# Patient Record
Sex: Female | Born: 1955 | Race: Black or African American | Hispanic: No | Marital: Married
Health system: Southern US, Community
[De-identification: ages and names within clinical notes are randomized; demographics above are authoritative.]

## PROBLEM LIST (undated history)

## (undated) DIAGNOSIS — K5790 Diverticulosis of intestine, part unspecified, without perforation or abscess without bleeding: Secondary | ICD-10-CM

## (undated) DIAGNOSIS — J45909 Unspecified asthma, uncomplicated: Secondary | ICD-10-CM

## (undated) HISTORY — PX: APPENDECTOMY: SHX54

## (undated) HISTORY — PX: ABDOMINAL HYSTERECTOMY: SHX81

## (undated) HISTORY — PX: REPLACEMENT TOTAL KNEE: SUR1224

---

## 2016-11-09 HISTORY — PX: REPLACEMENT TOTAL KNEE: SUR1224

## 2017-01-28 ENCOUNTER — Emergency Department (HOSPITAL_COMMUNITY)
Admission: EM | Admit: 2017-01-28 | Discharge: 2017-01-29 | Disposition: A | Discharge status: Home / Self Care | Attending: Emergency Medicine | Admitting: Emergency Medicine

## 2017-01-28 ENCOUNTER — Emergency Department (HOSPITAL_COMMUNITY)

## 2017-01-28 ENCOUNTER — Encounter (HOSPITAL_COMMUNITY): Payer: Self-pay | Admitting: Emergency Medicine

## 2017-01-28 ENCOUNTER — Emergency Department (HOSPITAL_COMMUNITY)
Admission: EM | Admit: 2017-01-28 | Discharge: 2017-01-28 | Discharge status: Against Medical Advice | Source: Home / Self Care

## 2017-01-28 DIAGNOSIS — R112 Nausea with vomiting, unspecified: Secondary | ICD-10-CM | POA: Diagnosis not present

## 2017-01-28 DIAGNOSIS — R1032 Left lower quadrant pain: Secondary | ICD-10-CM | POA: Diagnosis present

## 2017-01-28 DIAGNOSIS — Z96652 Presence of left artificial knee joint: Secondary | ICD-10-CM | POA: Insufficient documentation

## 2017-01-28 DIAGNOSIS — N289 Disorder of kidney and ureter, unspecified: Secondary | ICD-10-CM | POA: Diagnosis not present

## 2017-01-28 DIAGNOSIS — K5792 Diverticulitis of intestine, part unspecified, without perforation or abscess without bleeding: Secondary | ICD-10-CM | POA: Insufficient documentation

## 2017-01-28 DIAGNOSIS — J45909 Unspecified asthma, uncomplicated: Secondary | ICD-10-CM | POA: Diagnosis not present

## 2017-01-28 HISTORY — DX: Unspecified asthma, uncomplicated: J45.909

## 2017-01-28 HISTORY — DX: Diverticulosis of intestine, part unspecified, without perforation or abscess without bleeding: K57.90

## 2017-01-28 LAB — I-STAT CHEM 8, ED
BUN: 26 mg/dL — ABNORMAL HIGH (ref 6–20)
CHLORIDE: 105 mmol/L (ref 101–111)
Calcium, Ion: 1.09 mmol/L — ABNORMAL LOW (ref 1.15–1.40)
Creatinine, Ser: 1 mg/dL (ref 0.44–1.00)
GLUCOSE: 117 mg/dL — AB (ref 65–99)
HCT: 37 % (ref 36.0–46.0)
Hemoglobin: 12.6 g/dL (ref 12.0–15.0)
POTASSIUM: 4.1 mmol/L (ref 3.5–5.1)
Sodium: 141 mmol/L (ref 135–145)
TCO2: 25 mmol/L (ref 22–32)

## 2017-01-28 LAB — URINALYSIS, ROUTINE W REFLEX MICROSCOPIC
BILIRUBIN URINE: NEGATIVE
Bacteria, UA: NONE SEEN
Glucose, UA: NEGATIVE mg/dL
Hgb urine dipstick: NEGATIVE
Ketones, ur: NEGATIVE mg/dL
Leukocytes, UA: NEGATIVE
Nitrite: NEGATIVE
PH: 6 (ref 5.0–8.0)
Protein, ur: 30 mg/dL — AB

## 2017-01-28 LAB — COMPREHENSIVE METABOLIC PANEL
ALT: 22 U/L (ref 14–54)
ANION GAP: 15 (ref 5–15)
AST: 24 U/L (ref 15–41)
Albumin: 4.4 g/dL (ref 3.5–5.0)
Alkaline Phosphatase: 53 U/L (ref 38–126)
BUN: 22 mg/dL — ABNORMAL HIGH (ref 6–20)
CHLORIDE: 103 mmol/L (ref 101–111)
CO2: 22 mmol/L (ref 22–32)
Calcium: 9.8 mg/dL (ref 8.9–10.3)
Creatinine, Ser: 1.18 mg/dL — ABNORMAL HIGH (ref 0.44–1.00)
GFR calc non Af Amer: 48 mL/min — ABNORMAL LOW (ref >60)
GFR, EST AFRICAN AMERICAN: 56 mL/min — AB (ref >60)
Glucose, Bld: 127 mg/dL — ABNORMAL HIGH (ref 65–99)
POTASSIUM: 3.6 mmol/L (ref 3.5–5.1)
SODIUM: 140 mmol/L (ref 135–145)
Total Bilirubin: 0.5 mg/dL (ref 0.3–1.2)
Total Protein: 7.8 g/dL (ref 6.5–8.1)

## 2017-01-28 LAB — LIPASE, BLOOD: LIPASE: 35 U/L (ref 11–51)

## 2017-01-28 LAB — CBC
HCT: 33.8 % — ABNORMAL LOW (ref 36.0–46.0)
HEMOGLOBIN: 10.8 g/dL — AB (ref 12.0–15.0)
MCH: 27.1 pg (ref 26.0–34.0)
MCHC: 32 g/dL (ref 30.0–36.0)
MCV: 84.9 fL (ref 78.0–100.0)
PLATELETS: 259 10*3/uL (ref 150–400)
RBC: 3.98 MIL/uL (ref 3.87–5.11)
RDW: 14.2 % (ref 11.5–15.5)
WBC: 8.3 10*3/uL (ref 4.0–10.5)

## 2017-01-28 LAB — I-STAT TROPONIN, ED: TROPONIN I, POC: 0 ng/mL (ref 0.00–0.08)

## 2017-01-28 MED ORDER — HYDROMORPHONE HCL 1 MG/ML IJ SOLN
0.5000 mg | Freq: Once | INTRAMUSCULAR | Status: AC
  Administered 2017-01-28: 0.5 mg via INTRAVENOUS
  Filled 2017-01-28: qty 1

## 2017-01-28 MED ORDER — AMOXICILLIN-POT CLAVULANATE 875-125 MG PO TABS: 1.0000 | ORAL_TABLET | Freq: Two times a day (BID) | ORAL | 0 refills | Status: AC

## 2017-01-28 MED ORDER — SODIUM CHLORIDE 0.9 % IV SOLN
1.5000 g | Freq: Once | INTRAVENOUS | Status: AC
  Administered 2017-01-28: 1.5 g via INTRAVENOUS
  Filled 2017-01-28: qty 1.5

## 2017-01-28 MED ORDER — IOPAMIDOL (ISOVUE-300) INJECTION 61%
INTRAVENOUS | Status: AC
  Administered 2017-01-28: 100 mL via INTRAVENOUS
  Filled 2017-01-28: qty 100

## 2017-01-28 MED ORDER — HYDROMORPHONE HCL 1 MG/ML IJ SOLN
1.0000 mg | Freq: Once | INTRAMUSCULAR | Status: AC
  Administered 2017-01-28: 1 mg via INTRAVENOUS
  Filled 2017-01-28: qty 1

## 2017-01-28 MED ORDER — SODIUM CHLORIDE 0.9 % IV BOLUS (SEPSIS)
1000.0000 mL | Freq: Once | INTRAVENOUS | Status: AC
  Administered 2017-01-28: 1000 mL via INTRAVENOUS

## 2017-01-28 MED ORDER — ONDANSETRON 4 MG PO TBDP
4.0000 mg | ORAL_TABLET | Freq: Once | ORAL | Status: AC | PRN
  Administered 2017-01-28: 4 mg via ORAL
  Filled 2017-01-28: qty 1

## 2017-01-28 MED ORDER — ONDANSETRON HCL 4 MG/2ML IJ SOLN
4.0000 mg | Freq: Once | INTRAMUSCULAR | Status: AC
  Administered 2017-01-28: 4 mg via INTRAVENOUS
  Filled 2017-01-28: qty 2

## 2017-01-28 MED ORDER — ONDANSETRON 4 MG PO TBDP: ORAL_TABLET | ORAL | 0 refills | Status: AC

## 2017-01-28 NOTE — ED Triage Notes (Signed)
Pt was seen here earlier today for same but LWBS.

## 2017-01-28 NOTE — ED Notes (Signed)
Attempted an EKG, but Dr. Fayrene FearingJames was unable to read. Pt was crying uncontrollably, yelling and moving wildly side to side.

## 2017-01-28 NOTE — ED Triage Notes (Signed)
BIB EMS from home, called out for abd pain, back pain, chest pain. Pt refusing VS in triage, refusing EKG. Pt refuses to sit down and let this RN triage.

## 2017-01-28 NOTE — Discharge Instructions (Signed)
Gradually increase diet as tolerated starting with liquid and softs. Take antibiotics as directed. Take Zofran as needed for nausea and vomiting. See a clinician if you cannot keep her antibiotics and, recurrent vomiting, uncontrolled pain, persistent fevers or new concerns.  Have your primary doctor order MRI of your kidneys as you have a lesion.

## 2017-01-28 NOTE — ED Notes (Signed)
Called pt for triage, no answer 

## 2017-01-28 NOTE — ED Provider Notes (Signed)
[facility] EMERGENCY DEPARTMENT Provider Note   CSN: [id number] Arrival date & time: 01/28/17  1949     History   Chief Complaint Chief Complaint  Patient presents with  . Abdominal Pain  . Chest Pain    HPI Rachel Riley is a 62 y.o. female.  Patient presents with severe left lower quadrant abdominal pain started earlier this morning. This feels similar to her diverticulitis history. The pain however is more severe and patient has vomiting with it as well. Patient denies known history of kidney stones. Patient's had appendix removed in the past. Patient recently had knee replacement has been on pain meds for that and sciatica as well. No fevers or chills. No significant radiation.      Past Medical History:  Diagnosis Date  . Asthma   . Diverticulosis     There are no active problems to display for this patient.   Past Surgical History:  Procedure Laterality Date  . ABDOMINAL HYSTERECTOMY    . APPENDECTOMY    . 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 REPLACEMENT TOTAL KNEE Left     OB History    No data available       Home Medications    Prior to Admission medications   Medication Sig Start Date End Date Taking? Authorizing Provider  traMADol (ULTRAM) 50 MG tablet 50 mg.    Yes [provider]  amoxicillin-clavulanate (AUGMENTIN) 875-125 MG tablet Take 1 tablet by mouth 2 (two) times daily. One po bid x 7 days 01/28/17   Blane Ohara, MD  ondansetron (ZOFRAN ODT) 4 MG disintegrating tablet 4mg  ODT q4 hours prn nausea/vomit 01/28/17   Blane Ohara, MD    Family History No family history on file.  Social History Social History   Tobacco Use  . Smoking status: Not on file  Substance Use Topics  . Alcohol use: Not on file  . Drug use: Not on file     Allergies   Patient has no known allergies.   Review of Systems Review of Systems  Constitutional: Positive for fatigue. Negative for chills and fever.  HENT: Negative  for congestion.   Eyes: Negative for visual disturbance.  Respiratory: Negative for shortness of breath.   Cardiovascular: Negative for chest pain.  Gastrointestinal: Positive for abdominal pain, nausea and vomiting.  Genitourinary: Negative for dysuria and flank pain.  Musculoskeletal: Negative for back pain, neck pain and neck stiffness.  Skin: Negative for rash.  Neurological: Negative for light-headedness and headaches.     Physical Exam Updated Vital Signs BP 124/60   Pulse 76   Temp 98.1 F (36.7 C) (Oral)   Resp 13   SpO2 98%   Physical Exam  Constitutional: She is oriented to person, place, and time. She appears well-developed and well-nourished.  HENT:  Head: Normocephalic and atraumatic.  Eyes: Conjunctivae are normal. Right eye exhibits no discharge. Left eye exhibits no discharge.  Neck: Normal range of motion. Neck supple. No tracheal deviation present.  Cardiovascular: Normal rate and regular rhythm.  Pulmonary/Chest: Effort normal and breath sounds normal.  Abdominal: Soft. She exhibits no distension. There is tenderness (left lower abdomen). There is no guarding.  Musculoskeletal: She exhibits no edema.  Neurological: She is alert and oriented to person, place, and time.  Skin: Skin is warm. No rash noted.  Psychiatric: She has a normal mood and affect.  Nursing note and vitals reviewed.    ED Treatments / Results  Labs (all labs ordered  are listed, but only abnormal results are displayed) Labs Reviewed  COMPREHENSIVE METABOLIC PANEL - Abnormal; Notable for the following components:      Result Value   Glucose, Bld 127 (*)    BUN 22 (*)    Creatinine, Ser 1.18 (*)    GFR calc non Af Amer 48 (*)    GFR calc Af Amer 56 (*)    All other components within normal limits  CBC - Abnormal; Notable for the following components:   Hemoglobin 10.8 (*)    HCT 33.8 (*)    All other components within normal limits  URINALYSIS, ROUTINE W REFLEX MICROSCOPIC -  Abnormal; Notable for the following components:   Specific Gravity, Urine >1.046 (*)    Protein, ur 30 (*)    Squamous Epithelial / LPF 0-5 (*)    All other components within normal limits  I-STAT CHEM 8, ED - Abnormal; Notable for the following components:   BUN 26 (*)    Glucose, Bld 117 (*)    Calcium, Ion 1.09 (*)    All other components within normal limits  LIPASE, BLOOD  I-STAT TROPONIN, ED    EKG  EKG Interpretation  Date/Time:  Sunday January 28 2017 21:38:03 EST Ventricular Rate:  71 PR Interval:    QRS Duration: 94 QT Interval:  416 QTC Calculation: 453 R Axis:   75 Text Interpretation:  Sinus RSR' in V1 or V2, right VCD or RVH Borderline T abnormalities, anterior leads Confirmed by Blane Ohara [phone]) on 01/28/2017 10:41:55 PM       Radiology Ct Abdomen Pelvis W Contrast  Result Date: 01/28/2017 CLINICAL DATA:  Left lower quadrant abdominal pain EXAM: CT ABDOMEN AND PELVIS WITH CONTRAST TECHNIQUE: Multidetector CT imaging of the abdomen and pelvis was performed using the standard protocol following bolus administration of intravenous contrast. CONTRAST:  ISOVUE-300 IOPAMIDOL (ISOVUE-300) INJECTION 61% COMPARISON:  None. FINDINGS: Lower chest: Lung bases demonstrate no acute consolidation or effusion. Coronary artery calcification. Borderline cardiomegaly. Hepatobiliary: No focal liver abnormality is seen. No gallstones, gallbladder wall thickening, or biliary dilatation. Pancreas: Unremarkable. No pancreatic ductal dilatation or surrounding inflammatory changes. Spleen: Normal in size without focal abnormality. Adrenals/Urinary Tract: Adrenal glands are within normal limits. Kidneys show no hydronephrosis. There are multiple cysts within the bilateral kidneys. There are additional subcentimeter hypodensities too small to further characterize. 3.2 cm intermediate density lower pole lesion on the left. Bladder within normal limits Stomach/Bowel: The stomach is  nonenlarged. Surgical changes at the cecum consistent with appendectomy. Sigmoid colon diverticular present. Questionable mild wall thickening of sigmoid colon versus underdistention. No significant inflammatory changes Vascular/Lymphatic: Moderate aortic atherosclerosis. No aneurysmal dilatation. No significantly enlarged lymph nodes Reproductive: Status post hysterectomy. No adnexal masses. Other: Negative for free air or free fluid. Musculoskeletal: No acute or significant osseous findings. IMPRESSION: 1. Mild sigmoid colon diverticular disease. Questionable mild wall thickening/colitis/diverticulitis versus underdistention of sigmoid colon; given lack of surrounding inflammation, favor the latter. 2. There are no other acute abnormalities visualized 3. 3.2 cm intermediate density lesion lower pole left kidney. When the patient is clinically stable and able to follow directions and hold their breath (preferably as an outpatient) further evaluation with dedicated abdominal MRI should be considered. Electronically Signed   By: Jasmine Pang M.D.   On: 01/28/2017 22:44    Procedures Procedures (including critical care time) Emergency Ultrasound Study:   Angiocath insertion Performed by: Enid Skeens  Consent: Verbal consent obtained. Risks and benefits: risks, benefits and  alternatives were discussed Immediately prior to procedure the correct patient, procedure, equipment, support staff and site/side marked as needed.  Indication: difficult IV access Preparation: Patient was prepped and draped in the usual sterile fashion. Vein Location: right ac vein was visualized during assessment for potential access sites and was found to be patent/ easily compressed with linear ultrasound.  The needle was visualized with real-time ultrasound and guided into the vein. Gauge: 18 g  Image saved and stored.  Normal blood return.  Patient tolerance: Patient tolerated the procedure well with no immediate  complications.    Emergency Ultrasound Study:   Angiocath insertion Performed by: Enid SkeensJoshua M Hollin Crewe  Consent: Verbal consent obtained. Risks and benefits: risks, benefits and alternatives were discussed Immediately prior to procedure the correct patient, procedure, equipment, support staff and site/side marked as needed.  Indication: difficult IV access Preparation: Patient was prepped and draped in the usual sterile fashion. Vein Location: left ac vein was visualized during assessment for potential access sites and was found to be patent/ easily compressed with linear ultrasound.  The needle was visualized with real-time ultrasound and guided into the vein. Gauge: 18 g  Image saved and stored.  Normal blood return.  Patient tolerance: Patient tolerated the procedure well with no immediate complications.     Medications Ordered in ED Medications  ampicillin-sulbactam (UNASYN) 1.5 g in sodium chloride 0.9 % 50 mL IVPB (1.5 g Intravenous New Bag/Given 01/28/17 2340)  ondansetron (ZOFRAN-ODT) disintegrating tablet 4 mg (4 mg Oral Given 01/28/17 2003)  sodium chloride 0.9 % bolus 1,000 mL (1,000 mLs Intravenous New Bag/Given 01/28/17 2137)  ondansetron (ZOFRAN) injection 4 mg (4 mg Intravenous Given 01/28/17 2137)  HYDROmorphone (DILAUDID) injection 1 mg (1 mg Intravenous Given 01/28/17 2137)  iopamidol (ISOVUE-300) 61 % injection (100 mLs Intravenous Contrast Given 01/28/17 2214)  HYDROmorphone (DILAUDID) injection 0.5 mg (0.5 mg Intravenous Given 01/28/17 2340)     Initial Impression / Assessment and Plan / ED Course  I have reviewed the triage vital signs and the nursing notes.  Pertinent labs & imaging results that were available during my care of the patient were reviewed by me and considered in my medical decision making (see chart for details).    Patient presents with severe left lower quadrant abdominal pain. Patient very uncomfortable and moaning and screaming in pain. Nursing  unable to obtain IV. Ultrasound utilized to obtain 2 IVs and blood work. Plan for CT scan with IV contrast no oral contrast needed. Pain meds given patient improved.  Pt improved in ED, tolerating po.  First dose abx given unasyn.  Outpatient follow up for renal lesion.  Results and differential diagnosis were discussed with the patient/parent/guardian. Xrays were independently reviewed by myself.  Close follow up outpatient was discussed, comfortable with the plan.   Medications  ampicillin-sulbactam (UNASYN) 1.5 g in sodium chloride 0.9 % 50 mL IVPB (1.5 g Intravenous New Bag/Given 01/28/17 2340)  ondansetron (ZOFRAN-ODT) disintegrating tablet 4 mg (4 mg Oral Given 01/28/17 2003)  sodium chloride 0.9 % bolus 1,000 mL (1,000 mLs Intravenous New Bag/Given 01/28/17 2137)  ondansetron (ZOFRAN) injection 4 mg (4 mg Intravenous Given 01/28/17 2137)  HYDROmorphone (DILAUDID) injection 1 mg (1 mg Intravenous Given 01/28/17 2137)  iopamidol (ISOVUE-300) 61 % injection (100 mLs Intravenous Contrast Given 01/28/17 2214)  HYDROmorphone (DILAUDID) injection 0.5 mg (0.5 mg Intravenous Given 01/28/17 2340)    Vitals:   01/28/17 2145 01/28/17 2200 01/28/17 2320 01/28/17 2326  BP: (!) 150/75 (!) 151/69 124/60  Pulse: 75 72 76   Resp: 17 17 13    Temp:    98.1 F (36.7 C)  TempSrc:    Oral  SpO2: 100% 99% 98%     Final diagnoses:  Abdominal pain, acute, left lower quadrant  Acute diverticulitis    Final Clinical Impressions(s) / ED Diagnoses   Final diagnoses:  Abdominal pain, acute, left lower quadrant  Acute diverticulitis    ED Discharge Orders        Ordered    ondansetron (ZOFRAN ODT) 4 MG disintegrating tablet     01/28/17 2331    amoxicillin-clavulanate (AUGMENTIN) 875-125 MG tablet  2 times daily     01/28/17 2331       Blane Ohara, MD 01/28/17 2346

## 2017-01-28 NOTE — ED Notes (Addendum)
Attempted venipuncture x2, without success. Pt extremely agitated and difficult to remain seated.

## 2017-01-28 NOTE — ED Notes (Signed)
No answer from pt in waiting room 

## 2017-01-28 NOTE — ED Notes (Addendum)
Labs actually collected by this RN at 2130 on 1/20. Charted in error. Correct times on labels for Main Lab.

## 2017-01-28 NOTE — ED Triage Notes (Signed)
Pt unable to sit still long enough for EKG and blood work.

## 2017-01-28 NOTE — ED Notes (Signed)
Pt walking around the room, will not sit still, screaming and yelling at staff. Attempted to redirect pt into sitting still in order to get EKG and IV. Pt not cooperating. Pt stating she can't still long enough and that she can't take the pain anymore. This RN stating that we are unable to help her unless we can get an IV started. Pt then stated they already tried and attempted to leave. Family member redirected pt into room. Pt continuing to yell at staff.

## 2019-07-15 IMAGING — CT CT ABD-PELV W/ CM
2 of 5 series · 16 of 46 positions shown, 18 images · IV contrast (iopamidol)
Comparison: None.

CLINICAL DATA: Left lower quadrant abdominal pain

EXAM:
CT ABDOMEN AND PELVIS WITH CONTRAST
TECHNIQUE: Multidetector CT imaging of the abdomen and pelvis was performed
using the standard protocol following bolus administration of
intravenous contrast.
CONTRAST:  100mL [id number] IOPAMIDOL ([id number]) INJECTION 61%

[Series 3: a/p w/ 5mm · axial · 0.88mm/px · z∈[+905,+1325]mm · 13 of 94 slices shown, 15 images]
[im 5/94  soft-tissue]
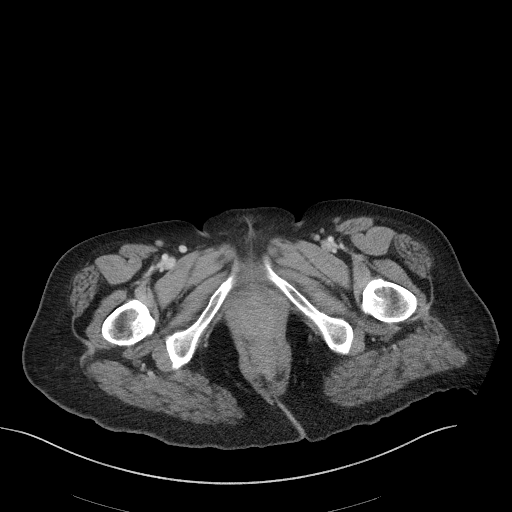
[im 5/94  bone]
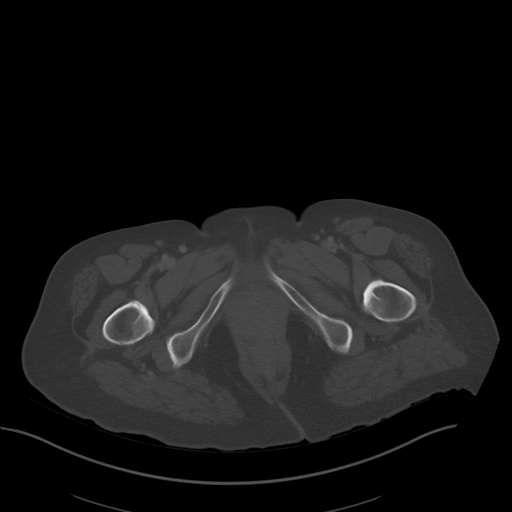
[im 15/94  soft-tissue]
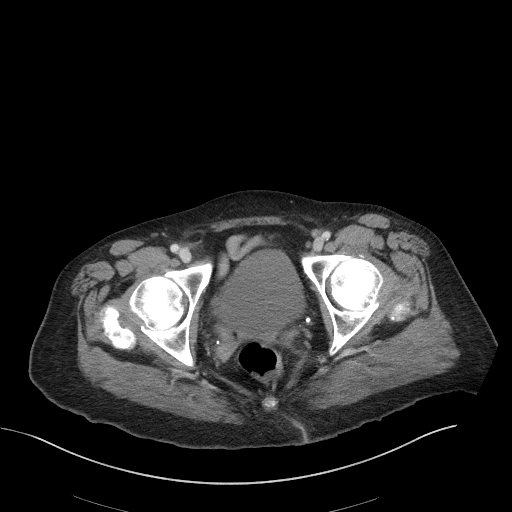
[im 20/94  soft-tissue]
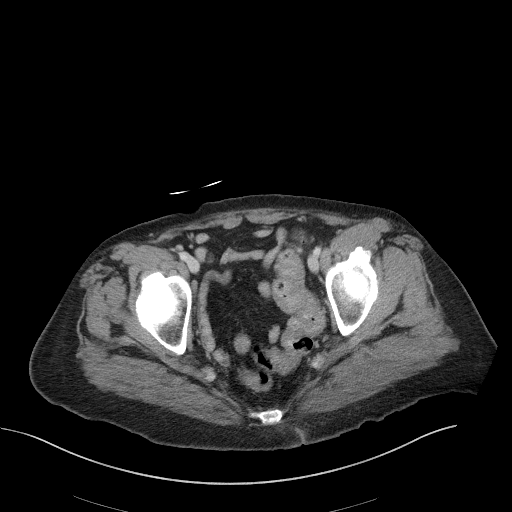
[im 25/94  soft-tissue]
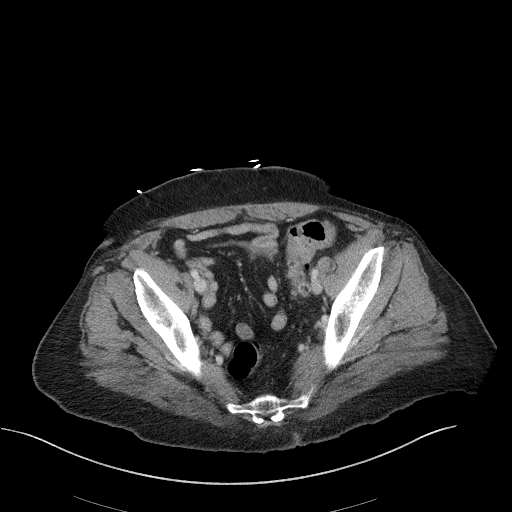
[im 35/94  soft-tissue]
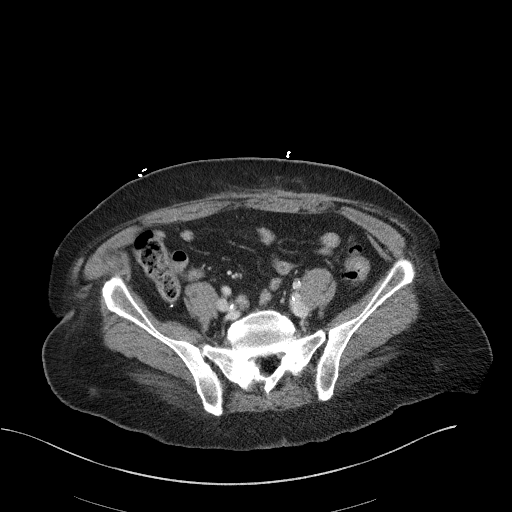
[im 40/94  soft-tissue]
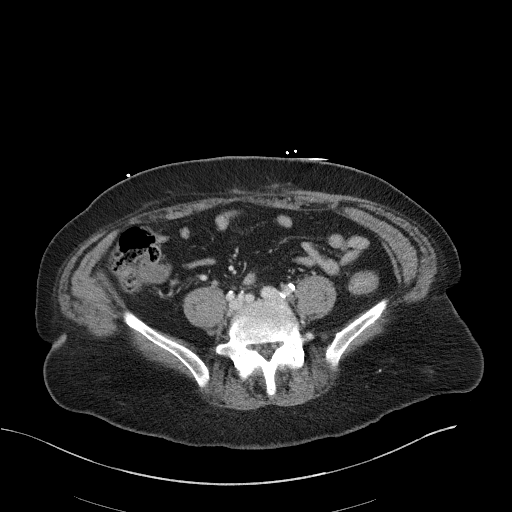
[im 49/94  soft-tissue]
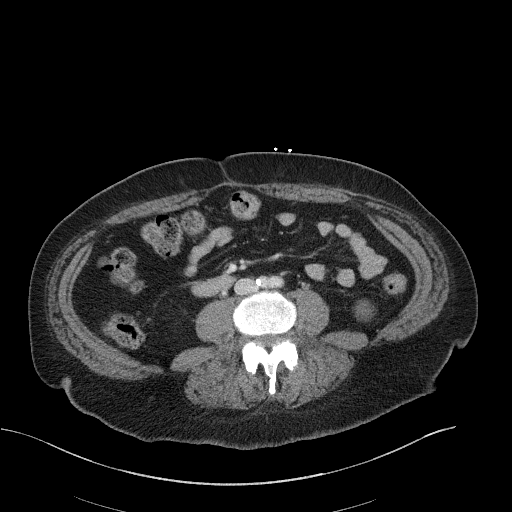
[im 54/94  soft-tissue]
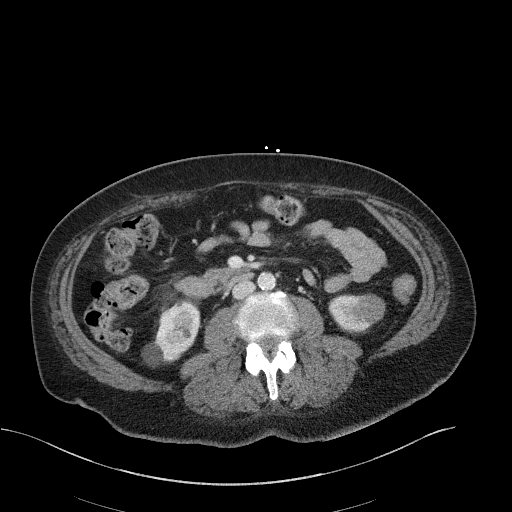
[im 59/94  soft-tissue]
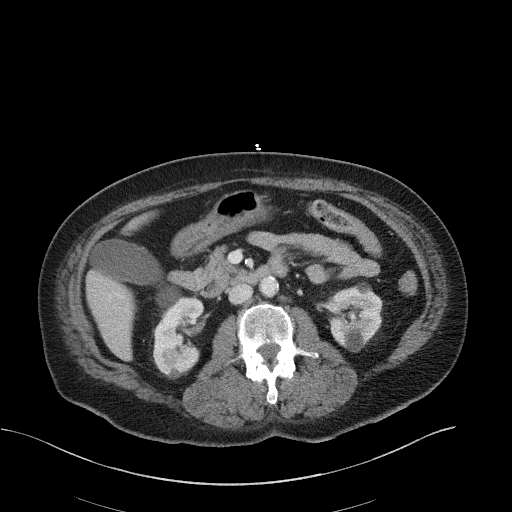
[im 59/94  bone]
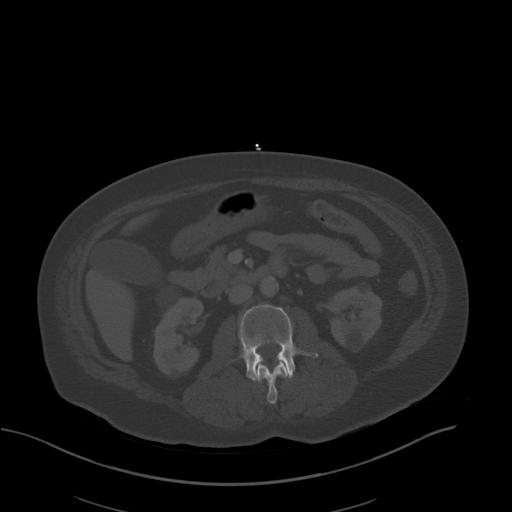
[im 69/94  soft-tissue]
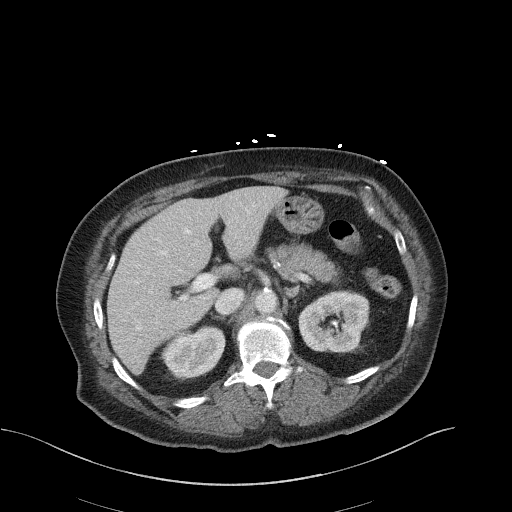
[im 74/94  soft-tissue]
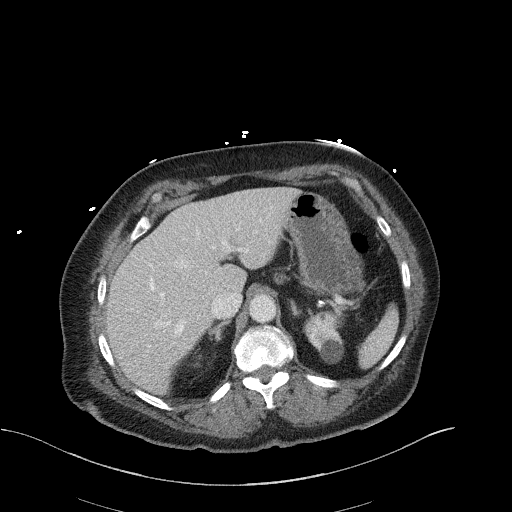
[im 79/94  soft-tissue]
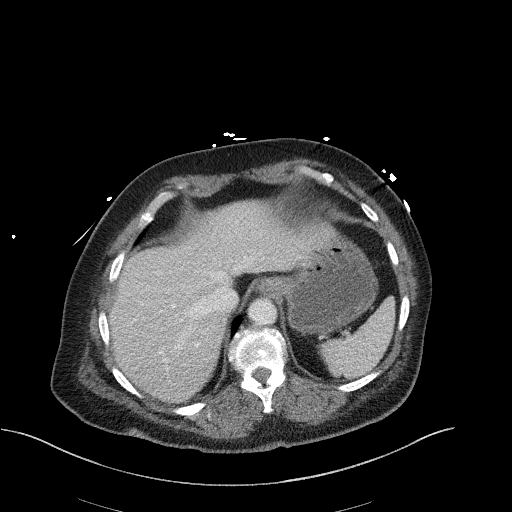
[im 89/94  soft-tissue]
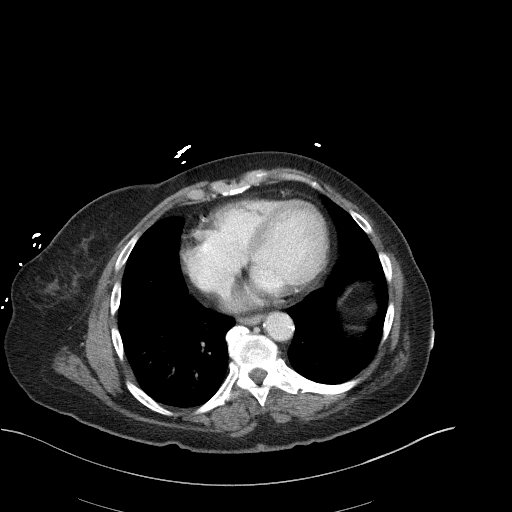

[Series 6: a/p w/ cor · coronal · 0.85mm/px · 3 of 151 slices shown]
[im 51/151  soft-tissue]
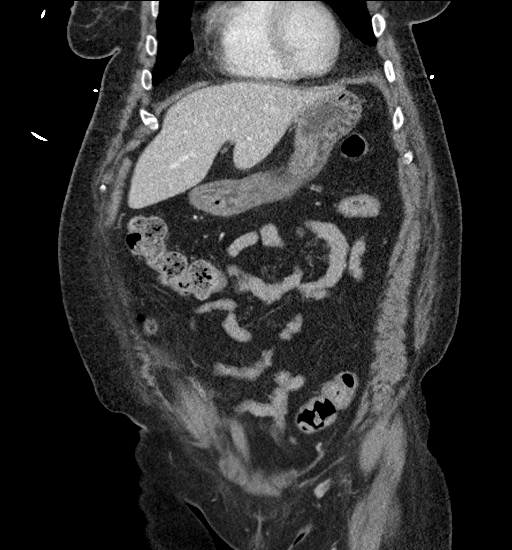
[im 67/151  soft-tissue]
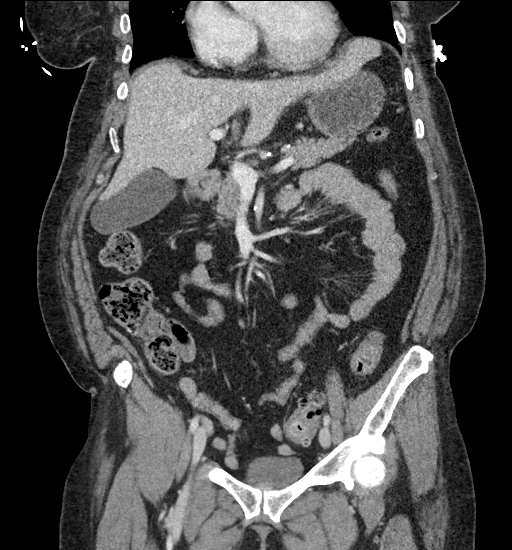
[im 84/151  soft-tissue]
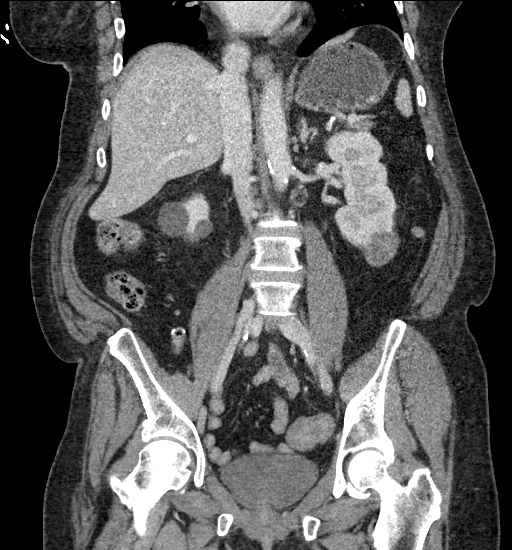

[16 of 46 positions shown; findings below may reference images not displayed]

FINDINGS: Lower chest: Lung bases demonstrate no acute consolidation or
effusion. Coronary artery calcification. Borderline cardiomegaly.

Hepatobiliary: No focal liver abnormality is seen. No gallstones,
gallbladder wall thickening, or biliary dilatation.

Pancreas: Unremarkable. No pancreatic ductal dilatation or
surrounding inflammatory changes.

Spleen: Normal in size without focal abnormality.

Adrenals/Urinary Tract: Adrenal glands are within normal limits.
Kidneys show no hydronephrosis. There are multiple cysts within the
bilateral kidneys. There are additional subcentimeter hypodensities
too small to further characterize. 3.2 cm intermediate density lower
pole lesion on the left. Bladder within normal limits

Stomach/Bowel: The stomach is nonenlarged. Surgical changes at the
cecum consistent with appendectomy. Sigmoid colon diverticular
present. Questionable mild wall thickening of sigmoid colon versus
underdistention. No significant inflammatory changes

Vascular/Lymphatic: Moderate aortic atherosclerosis. No aneurysmal
dilatation. No significantly enlarged lymph nodes

Reproductive: Status post hysterectomy. No adnexal masses.

Other: Negative for free air or free fluid.

Musculoskeletal: No acute or significant osseous findings.
IMPRESSION: 1. Mild sigmoid colon diverticular disease. Questionable mild wall
thickening/colitis/diverticulitis versus underdistention of sigmoid
colon; given lack of surrounding inflammation, favor the latter.
2. There are no other acute abnormalities visualized
3. 3.2 cm intermediate density lesion lower pole left kidney. When
the patient is clinically stable and able to follow directions and
hold their breath (preferably as an outpatient) further evaluation
with dedicated abdominal MRI should be considered.
